# Patient Record
Sex: Male | Born: 1970
Health system: Southern US, Community
[De-identification: ages and names within clinical notes are randomized; demographics above are authoritative.]

## PROBLEM LIST (undated history)

## (undated) DIAGNOSIS — I10 Essential (primary) hypertension: Secondary | ICD-10-CM

---

## 2019-04-02 ENCOUNTER — Other Ambulatory Visit: Payer: Self-pay

## 2019-04-02 ENCOUNTER — Emergency Department (HOSPITAL_BASED_OUTPATIENT_CLINIC_OR_DEPARTMENT_OTHER): Payer: BC Managed Care – PPO

## 2019-04-02 ENCOUNTER — Encounter (HOSPITAL_BASED_OUTPATIENT_CLINIC_OR_DEPARTMENT_OTHER): Payer: Self-pay | Admitting: Emergency Medicine

## 2019-04-02 ENCOUNTER — Emergency Department (HOSPITAL_BASED_OUTPATIENT_CLINIC_OR_DEPARTMENT_OTHER)
Admission: EM | Admit: 2019-04-02 | Discharge: 2019-04-02 | Disposition: A | Discharge status: Home / Self Care | Payer: BC Managed Care – PPO | Attending: Emergency Medicine | Admitting: Emergency Medicine

## 2019-04-02 DIAGNOSIS — N200 Calculus of kidney: Secondary | ICD-10-CM | POA: Insufficient documentation

## 2019-04-02 DIAGNOSIS — R1032 Left lower quadrant pain: Secondary | ICD-10-CM | POA: Diagnosis present

## 2019-04-02 HISTORY — DX: Essential (primary) hypertension: I10

## 2019-04-02 LAB — CBC WITH DIFFERENTIAL/PLATELET
Abs Immature Granulocytes: 0.02 10*3/uL (ref 0.00–0.07)
Basophils Absolute: 0.1 10*3/uL (ref 0.0–0.1)
Basophils Relative: 1 %
Eosinophils Absolute: 0 10*3/uL (ref 0.0–0.5)
Eosinophils Relative: 0 %
HCT: 45.4 % (ref 39.0–52.0)
Hemoglobin: 14.8 g/dL (ref 13.0–17.0)
Immature Granulocytes: 0 %
Lymphocytes Relative: 12 %
Lymphs Abs: 1.2 10*3/uL (ref 0.7–4.0)
MCH: 28.4 pg (ref 26.0–34.0)
MCHC: 32.6 g/dL (ref 30.0–36.0)
MCV: 87.1 fL (ref 80.0–100.0)
Monocytes Absolute: 0.4 10*3/uL (ref 0.1–1.0)
Monocytes Relative: 4 %
Neutro Abs: 8.3 10*3/uL — ABNORMAL HIGH (ref 1.7–7.7)
Neutrophils Relative %: 83 %
Platelets: 260 10*3/uL (ref 150–400)
RBC: 5.21 MIL/uL (ref 4.22–5.81)
RDW: 12.6 % (ref 11.5–15.5)
WBC: 9.9 10*3/uL (ref 4.0–10.5)
nRBC: 0 % (ref 0.0–0.2)

## 2019-04-02 LAB — URINALYSIS, MICROSCOPIC (REFLEX)
Bacteria, UA: NONE SEEN
Squamous Epithelial / HPF: NONE SEEN (ref 0–5)
WBC, UA: NONE SEEN WBC/hpf (ref 0–5)

## 2019-04-02 LAB — BASIC METABOLIC PANEL
Anion gap: 9 (ref 5–15)
BUN: 20 mg/dL (ref 6–20)
CO2: 26 mmol/L (ref 22–32)
Calcium: 9.6 mg/dL (ref 8.9–10.3)
Chloride: 101 mmol/L (ref 98–111)
Creatinine, Ser: 1.12 mg/dL (ref 0.61–1.24)
GFR calc Af Amer: >60 mL/min (ref >60)
GFR calc non Af Amer: >60 mL/min (ref >60)
Glucose, Bld: 144 mg/dL — ABNORMAL HIGH (ref 70–99)
Potassium: 3.8 mmol/L (ref 3.5–5.1)
Sodium: 136 mmol/L (ref 135–145)

## 2019-04-02 LAB — URINALYSIS, ROUTINE W REFLEX MICROSCOPIC
Bilirubin Urine: NEGATIVE
Glucose, UA: NEGATIVE mg/dL
Ketones, ur: NEGATIVE mg/dL
Leukocytes,Ua: NEGATIVE
Nitrite: NEGATIVE
Protein, ur: NEGATIVE mg/dL
Specific Gravity, Urine: 1.025 (ref 1.005–1.030)
pH: 6 (ref 5.0–8.0)

## 2019-04-02 MED ORDER — MORPHINE SULFATE (PF) 4 MG/ML IV SOLN
4.0000 mg | Freq: Once | INTRAVENOUS | Status: AC
  Administered 2019-04-02: 4 mg via INTRAVENOUS
  Filled 2019-04-02: qty 1

## 2019-04-02 MED ORDER — TAMSULOSIN HCL 0.4 MG PO CAPS: 0.4000 mg | ORAL_CAPSULE | Freq: Every day | ORAL | 0 refills | Status: AC

## 2019-04-02 MED ORDER — OXYCODONE HCL 5 MG PO TABS: 5.0000 mg | ORAL_TABLET | ORAL | 0 refills | Status: AC | PRN

## 2019-04-02 MED ORDER — SODIUM CHLORIDE 0.9 % IV BOLUS
1000.0000 mL | Freq: Once | INTRAVENOUS | Status: AC
  Administered 2019-04-02: 07:00:00 1000 mL via INTRAVENOUS

## 2019-04-02 MED ORDER — KETOROLAC TROMETHAMINE 30 MG/ML IJ SOLN
30.0000 mg | Freq: Once | INTRAMUSCULAR | Status: AC
  Administered 2019-04-02: 30 mg via INTRAVENOUS
  Filled 2019-04-02: qty 1

## 2019-04-02 MED ORDER — ONDANSETRON 4 MG PO TBDP: 4.0000 mg | ORAL_TABLET | Freq: Three times a day (TID) | ORAL | 0 refills | Status: AC | PRN

## 2019-04-02 MED ORDER — ONDANSETRON HCL 4 MG/2ML IJ SOLN
4.0000 mg | Freq: Once | INTRAMUSCULAR | Status: AC
  Administered 2019-04-02: 4 mg via INTRAVENOUS
  Filled 2019-04-02: qty 2

## 2019-04-02 MED FILL — TAMSULOSIN HCL 0.4 MG CAP: 0.4 | 14 days supply | Qty: 14 | Fill #0

## 2019-04-02 MED FILL — oxyCODONE HCL 5 MG TABS: 5 | 2 days supply | Qty: 12 | Fill #0

## 2019-04-02 MED FILL — ONDANSETRON ODT 4 MG TABLET: 4 | 5 days supply | Qty: 15 | Fill #0

## 2019-04-02 NOTE — ED Provider Notes (Signed)
  Physical Exam  BP (!) 144/80 (BP Location: Left Arm)   Pulse 78   Temp 97.7 F (36.5 C) (Oral)   Resp 16   Ht 5\' 8"  (1.727 m)   Wt 93 kg   SpO2 97%   BMI 31.17 kg/m   Physical Exam  ED Course/Procedures     Procedures  MDM  Received care from Dr. Dina Rich at [facility]. Please see her note for prior hx/physical.  Briefly this is a 48yo male presenting with flank pain. Found to have 63mm kidney stone. Renal function normal, no UTI.  Given toradol for pain with improvement to 0/10.  Discussed pain control, recommend ibuprofen, tylenol, reviewed in [place] drug database and gave rx for oxycodone with discussion of risks. Given zofran for nausea, flomax. Recommend urology follow up, hydration, discussed reasons to return. Patient discharged in stable condition with understanding of reasons to return.        Gareth Morgan, MD 04/02/19 [phone]

## 2019-04-02 NOTE — ED Notes (Signed)
ED Provider at bedside. 

## 2019-04-02 NOTE — ED Triage Notes (Signed)
Patient presents with complaints of sudden onset left flank pain with nausea around 0330 this am.

## 2019-04-02 NOTE — ED Provider Notes (Signed)
[place] EMERGENCY DEPARTMENT Provider Note   CSN: [id number] Arrival date & time: 04/02/19  9326     History No chief complaint on file.   Cody Chavez is a 48 y.o. male.  HPI     This is a 48 year old male with a history of hypertension who presents with left flank pain.  Patient reports acute onset of left flank pain around 2 AM.  He states the pain has waxed and waned.  It radiates into the left lower quadrant.  Currently his pain level is "an 11 out of 10."  He states that it has improved to an 8 out of 10 at times.  Nothing seems to make it better or worse.  He has never had pain like this before.  He denies any hematuria or dysuria.  He reports nausea without vomiting.  No known history of kidney stones but reports "my doctor found high uric acid levels."  Denies fever, chest pain, shortness of breath, cough, upper respiratory symptoms.  No past medical history on file.  There are no problems to display for this patient.  PMH: HTN  No family history on file.  Social History   Tobacco Use  . Smoking status: Not on file  Substance Use Topics  . Alcohol use: Not on file  . Drug use: Not on file    Home Medications Prior to Admission medications   Not on File    Allergies    Patient has no allergy information on record.  Review of Systems   Review of Systems  Constitutional: Negative for fever.  Respiratory: Negative for shortness of breath.   Cardiovascular: Negative for chest pain.  Gastrointestinal: Positive for nausea. Negative for abdominal pain, diarrhea and vomiting.  Genitourinary: Positive for flank pain. Negative for dysuria and hematuria.  All other systems reviewed and are negative.   Physical Exam Updated Vital Signs BP (!) 148/82 (BP Location: Right Arm)   Pulse 69   Temp 97.7 F (36.5 C) (Oral)   Resp 18   SpO2 98%   Physical Exam Vitals and nursing note reviewed.  Constitutional:      Appearance: He is  well-developed.     Comments: Uncomfortable appearing but nontoxic  HENT:     Head: Normocephalic and atraumatic.     Mouth/Throat:     Mouth: Mucous membranes are moist.  Eyes:     Pupils: Pupils are equal, round, and reactive to light.  Cardiovascular:     Rate and Rhythm: Normal rate and regular rhythm.     Heart sounds: Normal heart sounds. No murmur.  Pulmonary:     Effort: Pulmonary effort is normal. No respiratory distress.     Breath sounds: Normal breath sounds. No wheezing.  Abdominal:     General: Bowel sounds are normal.     Palpations: Abdomen is soft.     Tenderness: There is no abdominal tenderness. There is no rebound.  Musculoskeletal:     Cervical back: Neck supple.  Lymphadenopathy:     Cervical: No cervical adenopathy.  Skin:    General: Skin is warm and dry.  Neurological:     Mental Status: He is alert and oriented to person, place, and time.  Psychiatric:        Mood and Affect: Mood normal.     ED Results / Procedures / Treatments   Labs (all labs ordered are listed, but only abnormal results are displayed) Labs Reviewed  CBC WITH DIFFERENTIAL/PLATELET  BASIC METABOLIC  PANEL  URINALYSIS, ROUTINE W REFLEX MICROSCOPIC    EKG None  Radiology No results found.  Procedures Procedures (including critical care time)  Medications Ordered in ED Medications  sodium chloride 0.9 % bolus 1,000 mL (has no administration in time range)  morphine 4 MG/ML injection 4 mg (has no administration in time range)  ondansetron (ZOFRAN) injection 4 mg (has no administration in time range)    ED Course  I have reviewed the triage vital signs and the nursing notes.  Pertinent labs & imaging results that were available during my care of the patient were reviewed by me and considered in my medical decision making (see chart for details).    MDM Rules/Calculators/A&P                      Patient presents with acute onset of left-sided flank and abdominal  pain.  He is uncomfortable appearing but nontoxic.  History is highly suggestive of kidney stone.  No prior history.  He does not have any tenderness on abdominal exam.  Lab work obtained.  Patient given fluids, pain and nausea medication.  Will obtain a CT stone study.  Patient to be signed out to oncoming provider.   Final Clinical Impression(s) / ED Diagnoses Final diagnoses:  None    Rx / DC Orders ED Discharge Orders    None       Horton, Mayer Masker, MD 04/02/19 [phone]

## 2019-04-02 NOTE — Discharge Instructions (Addendum)
You may take Tylenol (acetaminophen) 1000 mg 3 times a day OR 500mg  6 times a day. Please check other over-the-counter medications and prescriptions to ensure you are not taking other medications that contain acetaminophen at this dose.  You may also take ibuprofen 400 mg 6 times a day alternating with or at the same time as tylenol.  Take oxycodone as needed for breakthrough pain.  This medication can be addicting, sedating and cause constipation.

## 2020-06-11 IMAGING — CT CT RENAL STONE PROTOCOL
2 of 4 series · 15 of 46 positions shown, 17 images · non-contrast
Comparison: None.

CLINICAL DATA: Left flank pain

EXAM:
CT ABDOMEN AND PELVIS WITHOUT CONTRAST
TECHNIQUE: Multidetector CT imaging of the abdomen and pelvis was performed
following the standard protocol without oral or IV contrast.

[Series 2: axial st · axial · 0.86mm/px · z∈[-599,-29]mm · 12 of 126 slices shown, 14 images]
[im 6/126  soft-tissue]
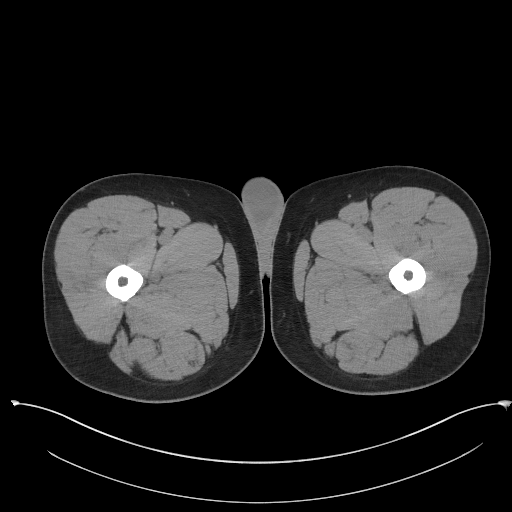
[im 6/126  bone]
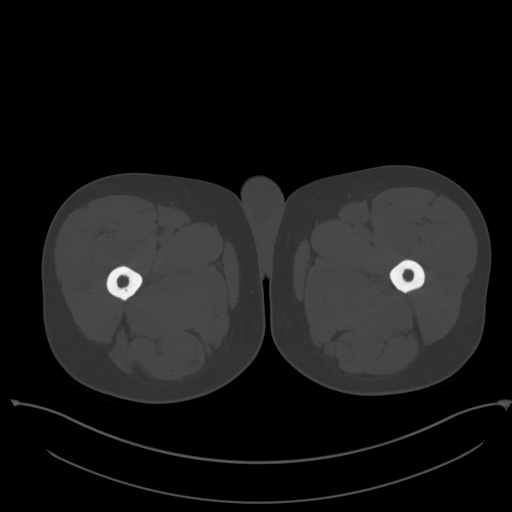
[im 16/126  soft-tissue]
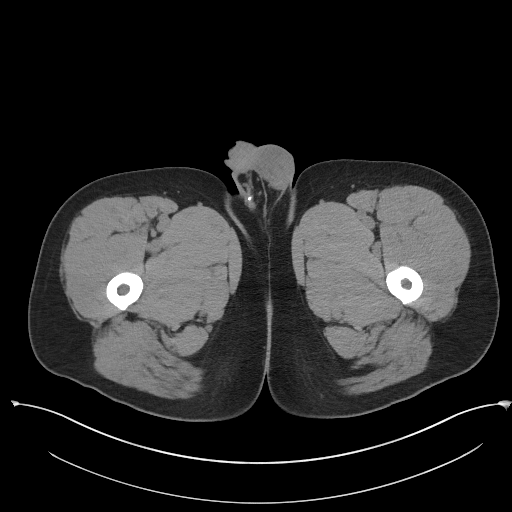
[im 27/126  soft-tissue]
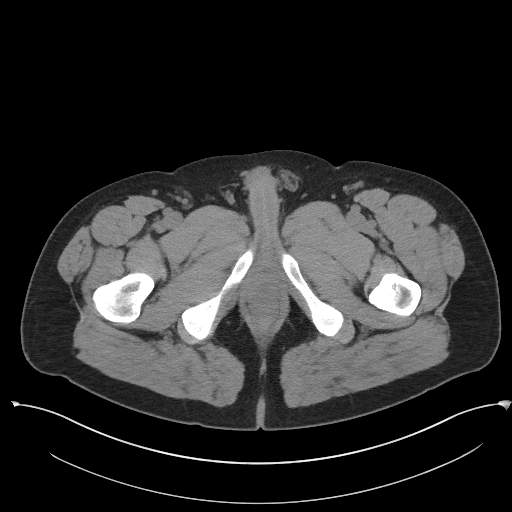
[im 37/126  soft-tissue]
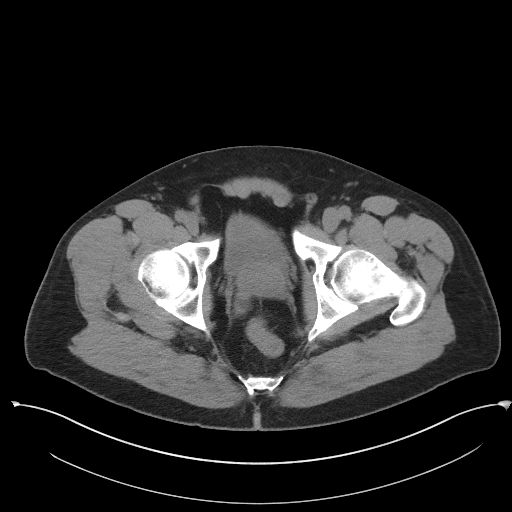
[im 47/126  soft-tissue]
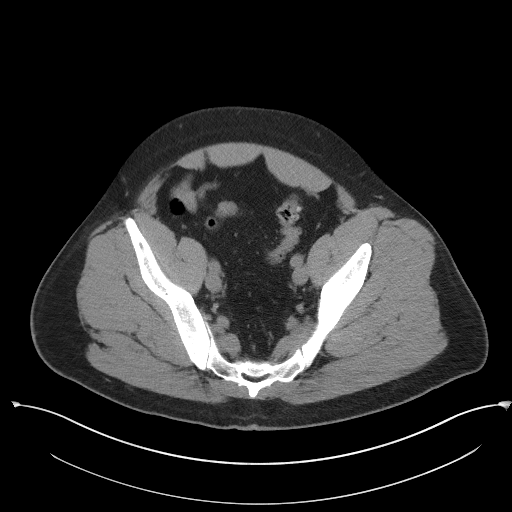
[im 58/126  soft-tissue]
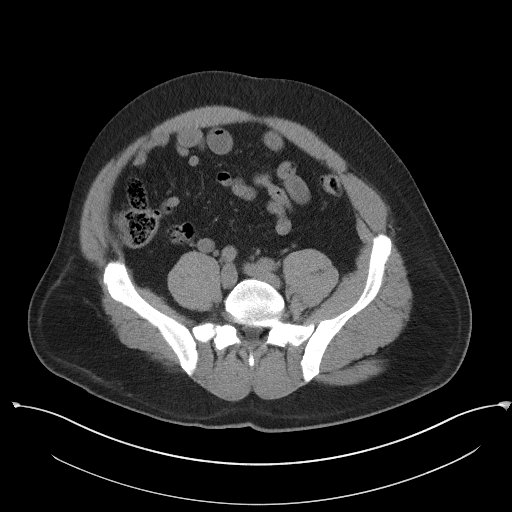
[im 68/126  soft-tissue]
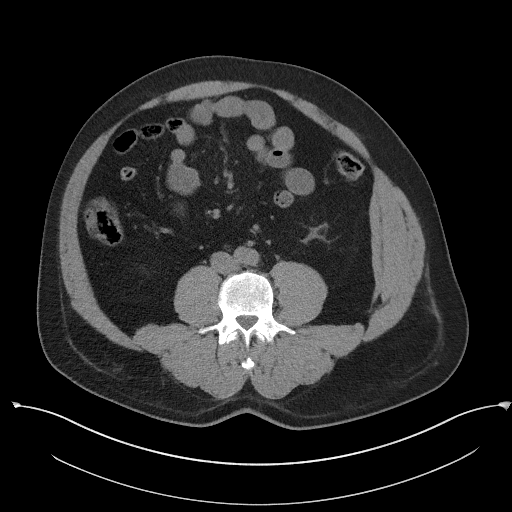
[im 79/126  soft-tissue]
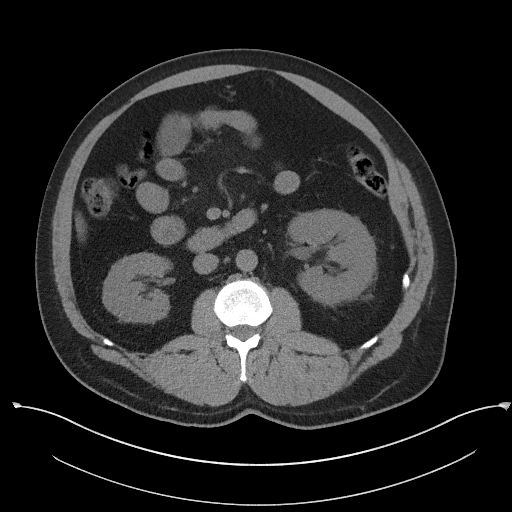
[im 89/126  soft-tissue]
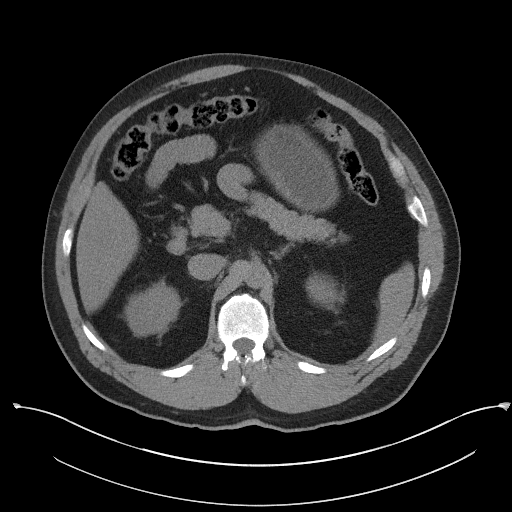
[im 89/126  bone]
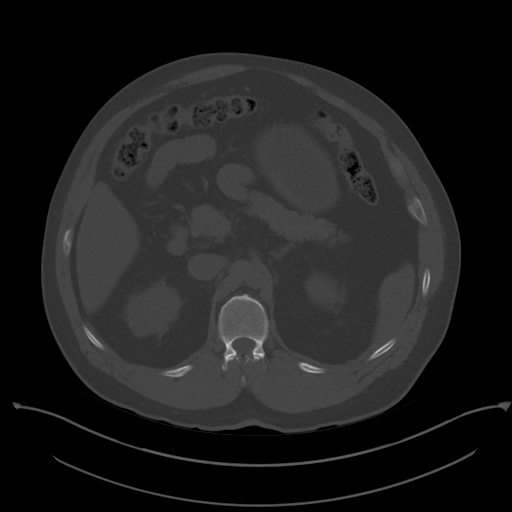
[im 99/126  soft-tissue]
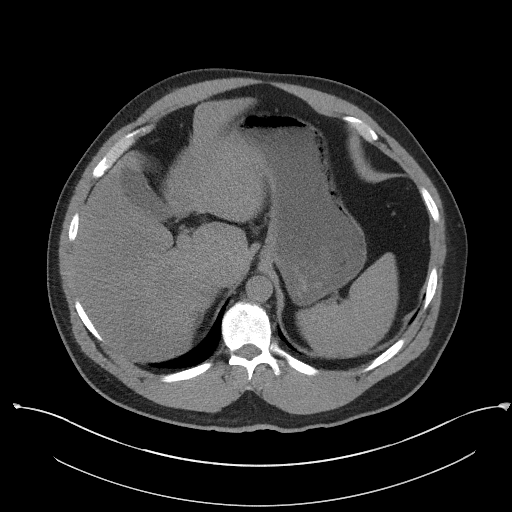
[im 110/126  soft-tissue]
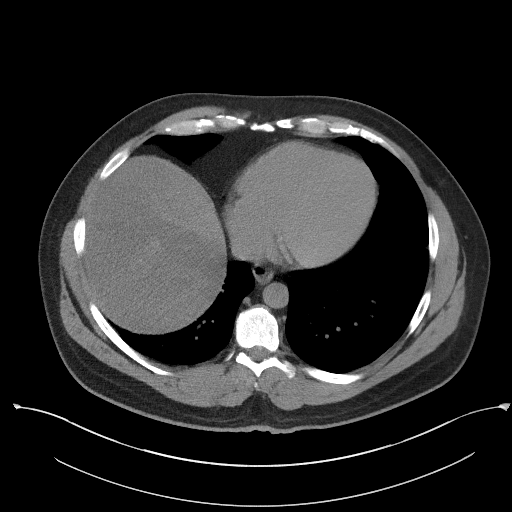
[im 120/126  soft-tissue]
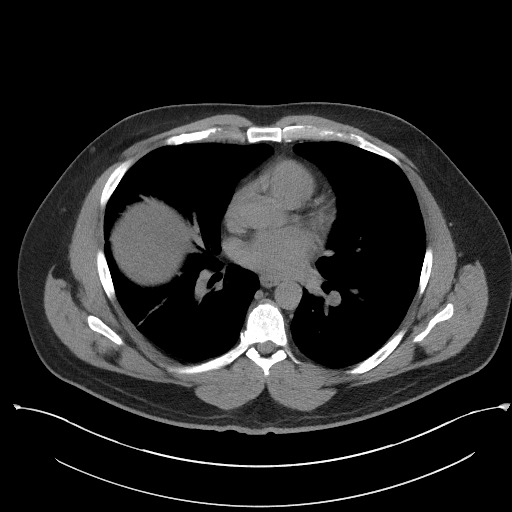

[Series 4: coronal st · coronal · 0.91mm/px · 3 of 111 slices shown]
[im 37/111  soft-tissue]
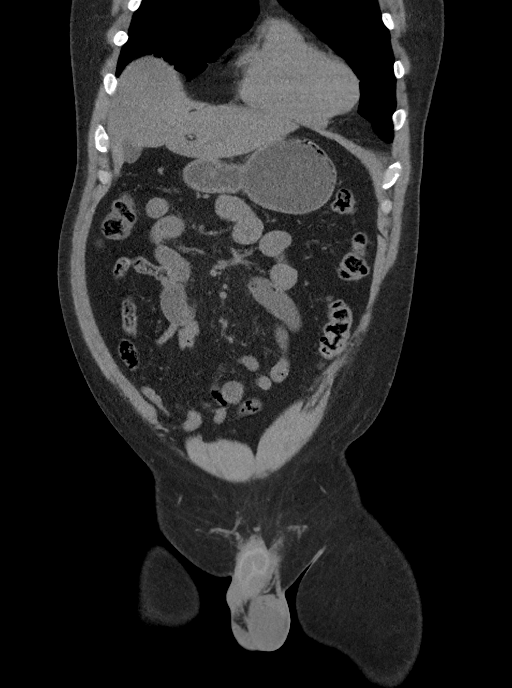
[im 49/111  soft-tissue]
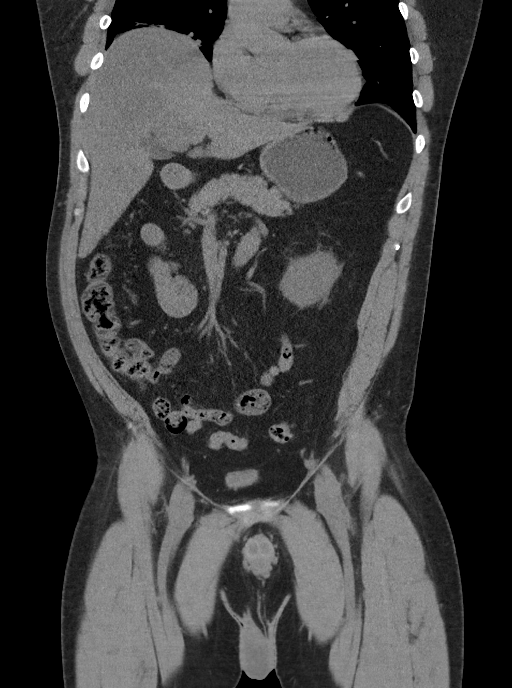
[im 62/111  soft-tissue]
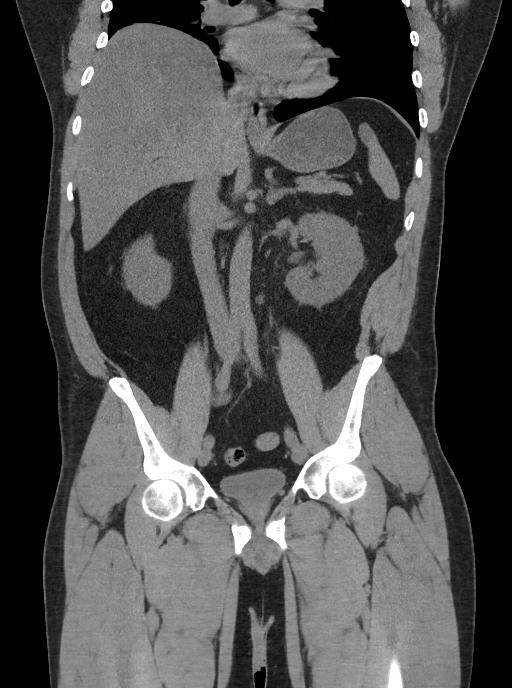

[15 of 46 positions shown; findings below may reference images not displayed]

FINDINGS: Lower chest: There is mild atelectatic change in the left base.
There is a nodular opacity along the lateral aspect of the left
minor fissure measuring 7 x 5 mm, seen on axial slice 18 series 6.

Hepatobiliary: There is hepatic steatosis. No focal liver lesions
are evident on this noncontrast enhanced study. Gallbladder wall is
not appreciably thickened. There is no biliary duct dilatation.

Pancreas: There is no pancreatic mass or inflammatory focus.

Spleen: No splenic lesions are evident.

Adrenals/Urinary Tract: Right adrenal appears normal. There is an
adenoma arising from the left adrenal measuring 1.2 x 1.1 cm. Left
kidney is edematous. There is no appreciable renal mass on either
side. There is slight hydronephrosis on the left. There is no
hydronephrosis on the right. On the right, there is a 2 mm calculus
in the upper to midportion of the kidney. On the left, there is a 3
x 2 mm calculus in the lower pole region. There is a calculus in the
proximal left ureter at the L3 level measuring 4 x 3 mm. No other
ureteral calculus is evident on either side. Urinary bladder is
midline with wall thickness within normal limits.

Stomach/Bowel: There are scattered sigmoid diverticula without
diverticulitis. There is no appreciable bowel wall or mesenteric
thickening. There is no evident bowel obstruction. The terminal
ileum appears normal. There is no demonstrable free air or portal
venous air.

Vascular/Lymphatic: There is no abdominal aortic aneurysm. No
vascular lesions are evident on this noncontrast enhanced study.
There is no adenopathy in the abdomen or pelvis.

Reproductive: Prostate and seminal vesicles are normal in size and
contour. There is no evident pelvic mass.

Other: There is no periappendiceal region inflammation. No abscess
or ascites is evident in the abdomen or pelvis. There is mild fat in
the umbilicus. There is mild fat in the right inguinal ring.

Musculoskeletal: There is degenerative change in the lumbar spine.
There are no blastic or lytic bone lesions. No intramuscular lesions
are evident.
IMPRESSION: 1. There is a 4 x 3 mm calculus in the proximal left ureter at the
L3 level with slight hydronephrosis on the left and edema of the
left kidney.
2. Bilateral nephrolithiasis.
3. There is a 7 x 5 mm nodular opacity in the lateral left base
region. Non-contrast chest CT at 6-12 months is recommended. If the
nodule is stable at time of repeat CT, then future CT at 18-24
months (from today's scan) is considered optional for low-risk
patients, but is recommended for high-risk patients. This
recommendation follows the consensus statement: Guidelines for
Management of Incidental Pulmonary Nodules Detected on CT Images:
4. Left adrenal adenoma measuring 1.2 x 1.1 cm.
5. Sigmoid diverticulosis without diverticulitis.
6. Small amount of fat in the umbilicus. Mild fat in the right
inguinal ring.
7. Hepatic steatosis.

## 2021-05-04 ENCOUNTER — Emergency Department (HOSPITAL_BASED_OUTPATIENT_CLINIC_OR_DEPARTMENT_OTHER)
Admission: EM | Admit: 2021-05-04 | Discharge: 2021-05-04 | Disposition: A | Discharge status: Home / Self Care | Payer: BC Managed Care – PPO | Attending: Emergency Medicine | Admitting: Emergency Medicine

## 2021-05-04 ENCOUNTER — Other Ambulatory Visit: Payer: Self-pay

## 2021-05-04 ENCOUNTER — Encounter (HOSPITAL_BASED_OUTPATIENT_CLINIC_OR_DEPARTMENT_OTHER): Payer: Self-pay | Admitting: Urology

## 2021-05-04 ENCOUNTER — Emergency Department (HOSPITAL_BASED_OUTPATIENT_CLINIC_OR_DEPARTMENT_OTHER): Payer: BC Managed Care – PPO

## 2021-05-04 DIAGNOSIS — Z7982 Long term (current) use of aspirin: Secondary | ICD-10-CM | POA: Diagnosis not present

## 2021-05-04 DIAGNOSIS — R0789 Other chest pain: Secondary | ICD-10-CM | POA: Insufficient documentation

## 2021-05-04 DIAGNOSIS — Z79899 Other long term (current) drug therapy: Secondary | ICD-10-CM | POA: Diagnosis not present

## 2021-05-04 DIAGNOSIS — I10 Essential (primary) hypertension: Secondary | ICD-10-CM | POA: Diagnosis not present

## 2021-05-04 DIAGNOSIS — R079 Chest pain, unspecified: Secondary | ICD-10-CM

## 2021-05-04 LAB — BASIC METABOLIC PANEL
Anion gap: 9 (ref 5–15)
BUN: 20 mg/dL (ref 6–20)
CO2: 24 mmol/L (ref 22–32)
Calcium: 9.4 mg/dL (ref 8.9–10.3)
Chloride: 102 mmol/L (ref 98–111)
Creatinine, Ser: 1.08 mg/dL (ref 0.61–1.24)
GFR, Estimated: >60 mL/min (ref >60)
Glucose, Bld: 113 mg/dL — ABNORMAL HIGH (ref 70–99)
Potassium: 3.9 mmol/L (ref 3.5–5.1)
Sodium: 135 mmol/L (ref 135–145)

## 2021-05-04 LAB — TROPONIN I (HIGH SENSITIVITY): Troponin I (High Sensitivity): 3 ng/L (ref <18)

## 2021-05-04 LAB — CBC
HCT: 45 % (ref 39.0–52.0)
Hemoglobin: 15.5 g/dL (ref 13.0–17.0)
MCH: 29.5 pg (ref 26.0–34.0)
MCHC: 34.4 g/dL (ref 30.0–36.0)
MCV: 85.6 fL (ref 80.0–100.0)
Platelets: 256 10*3/uL (ref 150–400)
RBC: 5.26 MIL/uL (ref 4.22–5.81)
RDW: 12.1 % (ref 11.5–15.5)
WBC: 6.9 10*3/uL (ref 4.0–10.5)
nRBC: 0 % (ref 0.0–0.2)

## 2021-05-04 NOTE — ED Triage Notes (Signed)
Left sided chest pain that started at approx 1200.  Had same pain on Sunday that went away  H/o htn , took asa 81 mg today  Denies SOB  Ran yesterday 2.5 miles with no concerns  NAD now A& O x 4

## 2021-05-04 NOTE — ED Provider Notes (Signed)
[name]   CSN: [id number] Arrival date & time: 05/04/21  1654     History  Chief Complaint  Patient presents with   Chest Pain    Cody Chavez is a 51 y.o. male.  Presents to ER with concern for chest pain.  Patient states that pain started around noon today.  Described as 2-3 out of 10 in severity, intermittent, left-sided, discomfort.  No frank pain.  Nonradiating.  No obvious alleviating or aggravating factors.  Not associated with exertion.  States that he did incline workout on treadmill early this morning and had no symptoms at that time.  Pain started 8 hours later.  Also went on a run yesterday and had noted symptoms.  No associated difficulty breathing.  He does have history of high cholesterol and high blood pressure.  Denies smoking history.  No history of coronary artery disease in first-degree relative.  No known CAD history.  HPI     Home Medications Prior to Admission medications   Medication Sig Start Date End Date Taking? Authorizing Provider  allopurinol (ZYLOPRIM) 100 MG tablet Take by mouth. 11/14/18   [provider]  aspirin 81 MG EC tablet Take by mouth.    [provider]  atorvastatin (LIPITOR) 10 MG tablet Take 1 tablet by mouth once daily 06/19/18   [provider]  lisinopril (ZESTRIL) 20 MG tablet Take 1 tablet by mouth once daily 09/09/16   [provider]  ondansetron (ZOFRAN ODT) 4 MG disintegrating tablet Take 1 tablet (4 mg total) by mouth every 8 (eight) hours as needed for nausea or vomiting. 04/02/19   Gareth Morgan, MD  oxyCODONE (ROXICODONE) 5 MG immediate release tablet Take 1 tablet (5 mg total) by mouth every 4 (four) hours as needed for severe pain. 04/02/19   Gareth Morgan, MD      Allergies    Patient has no known allergies.    Review of Systems   Review of Systems  Constitutional:  Negative for chills and fever.  HENT:  Negative for ear pain and  sore throat.   Eyes:  Negative for pain and visual disturbance.  Respiratory:  Negative for cough, chest tightness and shortness of breath.   Cardiovascular:  Positive for chest pain. Negative for palpitations.  Gastrointestinal:  Negative for abdominal pain and vomiting.  Genitourinary:  Negative for dysuria and hematuria.  Musculoskeletal:  Negative for arthralgias and back pain.  Skin:  Negative for color change and rash.  Neurological:  Negative for seizures and syncope.  All other systems reviewed and are negative.  Physical Exam Updated Vital Signs BP (!) 153/92    Pulse 73    Temp 98.2 F (36.8 C) (Oral)    Resp 20    Ht 5\' 8"  (1.727 m)    Wt 97.1 kg    SpO2 97%    BMI 32.54 kg/m  Physical Exam Vitals and nursing note reviewed.  Constitutional:      General: He is not in acute distress.    Appearance: He is well-developed.  HENT:     Head: Normocephalic and atraumatic.  Eyes:     Conjunctiva/sclera: Conjunctivae normal.  Cardiovascular:     Rate and Rhythm: Normal rate and regular rhythm.     Heart sounds: No murmur heard. Pulmonary:     Effort: Pulmonary effort is normal. No respiratory distress.     Breath sounds: Normal breath sounds.  Abdominal:     Palpations: Abdomen  is soft.     Tenderness: There is no abdominal tenderness.  Musculoskeletal:        General: No swelling.     Cervical back: Neck supple.     Right lower leg: No edema.     Left lower leg: No edema.  Skin:    General: Skin is warm and dry.     Capillary Refill: Capillary refill takes less than 2 seconds.  Neurological:     Mental Status: He is alert.  Psychiatric:        Mood and Affect: Mood normal.    ED Results / Procedures / Treatments   Labs (all labs ordered are listed, but only abnormal results are displayed) Labs Reviewed  BASIC METABOLIC PANEL - Abnormal; Notable for the following components:      Result Value   Glucose, Bld 113 (*)    All other components within normal limits   CBC  TROPONIN I (HIGH SENSITIVITY)  TROPONIN I (HIGH SENSITIVITY)    EKG None  Radiology DG Chest 2 View  Result Date: 05/04/2021 CLINICAL DATA:  Chest pain EXAM: CHEST - 2 VIEW COMPARISON:  None. FINDINGS: Elevation of the right hemidiaphragm. This was present on a 2020 abdomen CT. Atelectasis/scarring at the right lung base. No pleural effusion or pneumothorax. Cardiomediastinal contours are within normal limits. No acute osseous abnormality. IMPRESSION: Chronic elevation of right hemidiaphragm with mild atelectasis/scarring at the lung base. No acute abnormality identified. Electronically Signed   By: Macy Mis M.D.   On: 05/04/2021 17:33    Procedures Procedures    Medications Ordered in ED Medications - No data to display  ED Course/ Medical Decision Making/ A&P                           Medical Decision Making Amount and/or Complexity of Data Reviewed Labs: ordered. Radiology: ordered.   51 y/o male with HL, HTN presented to ER after having episode of chest discomfort this afternoon.  Currently pain-free and well-appearing with grossly stable vital signs.  His EKG does not have any acute ischemic change and his troponin is within normal limits.  CXR independently reviewed, no acute pathology identified.  No anemia or electrolyte derangement.  Notably patient consistently works out and has never had any chest discomfort associated with exertion.  Given his history and the work-up today I have a very low suspicion for ACS.  Given patient is no longer asymptomatic and has low HEART score and reassuring work-up today, feel he can be discharged and managed in the outpatient setting.  Recommend he follow-up closely with his primary care doctor to discuss this episode, reviewed return precautions with patient as well as his wife at bedside.  Discharged home.        Final Clinical Impression(s) / ED Diagnoses Final diagnoses:  Chest pain, unspecified type    Rx / DC  Orders ED Discharge Orders     None         Lucrezia Starch, MD 05/04/21 2302

## 2021-05-04 NOTE — Discharge Instructions (Signed)
I would recommend following up with your primary care doctor to discuss symptoms that you are experiencing today.  If you develop recurrent chest pain, any difficulty breathing or other new concerning symptom, please come back to ER for reassessment.

## 2022-07-14 IMAGING — DX DG CHEST 2V
2 series · 2 of 2 positions shown · non-contrast
Comparison: None.

CLINICAL DATA: Chest pain

EXAM:
CHEST - 2 VIEW

[chest pa]
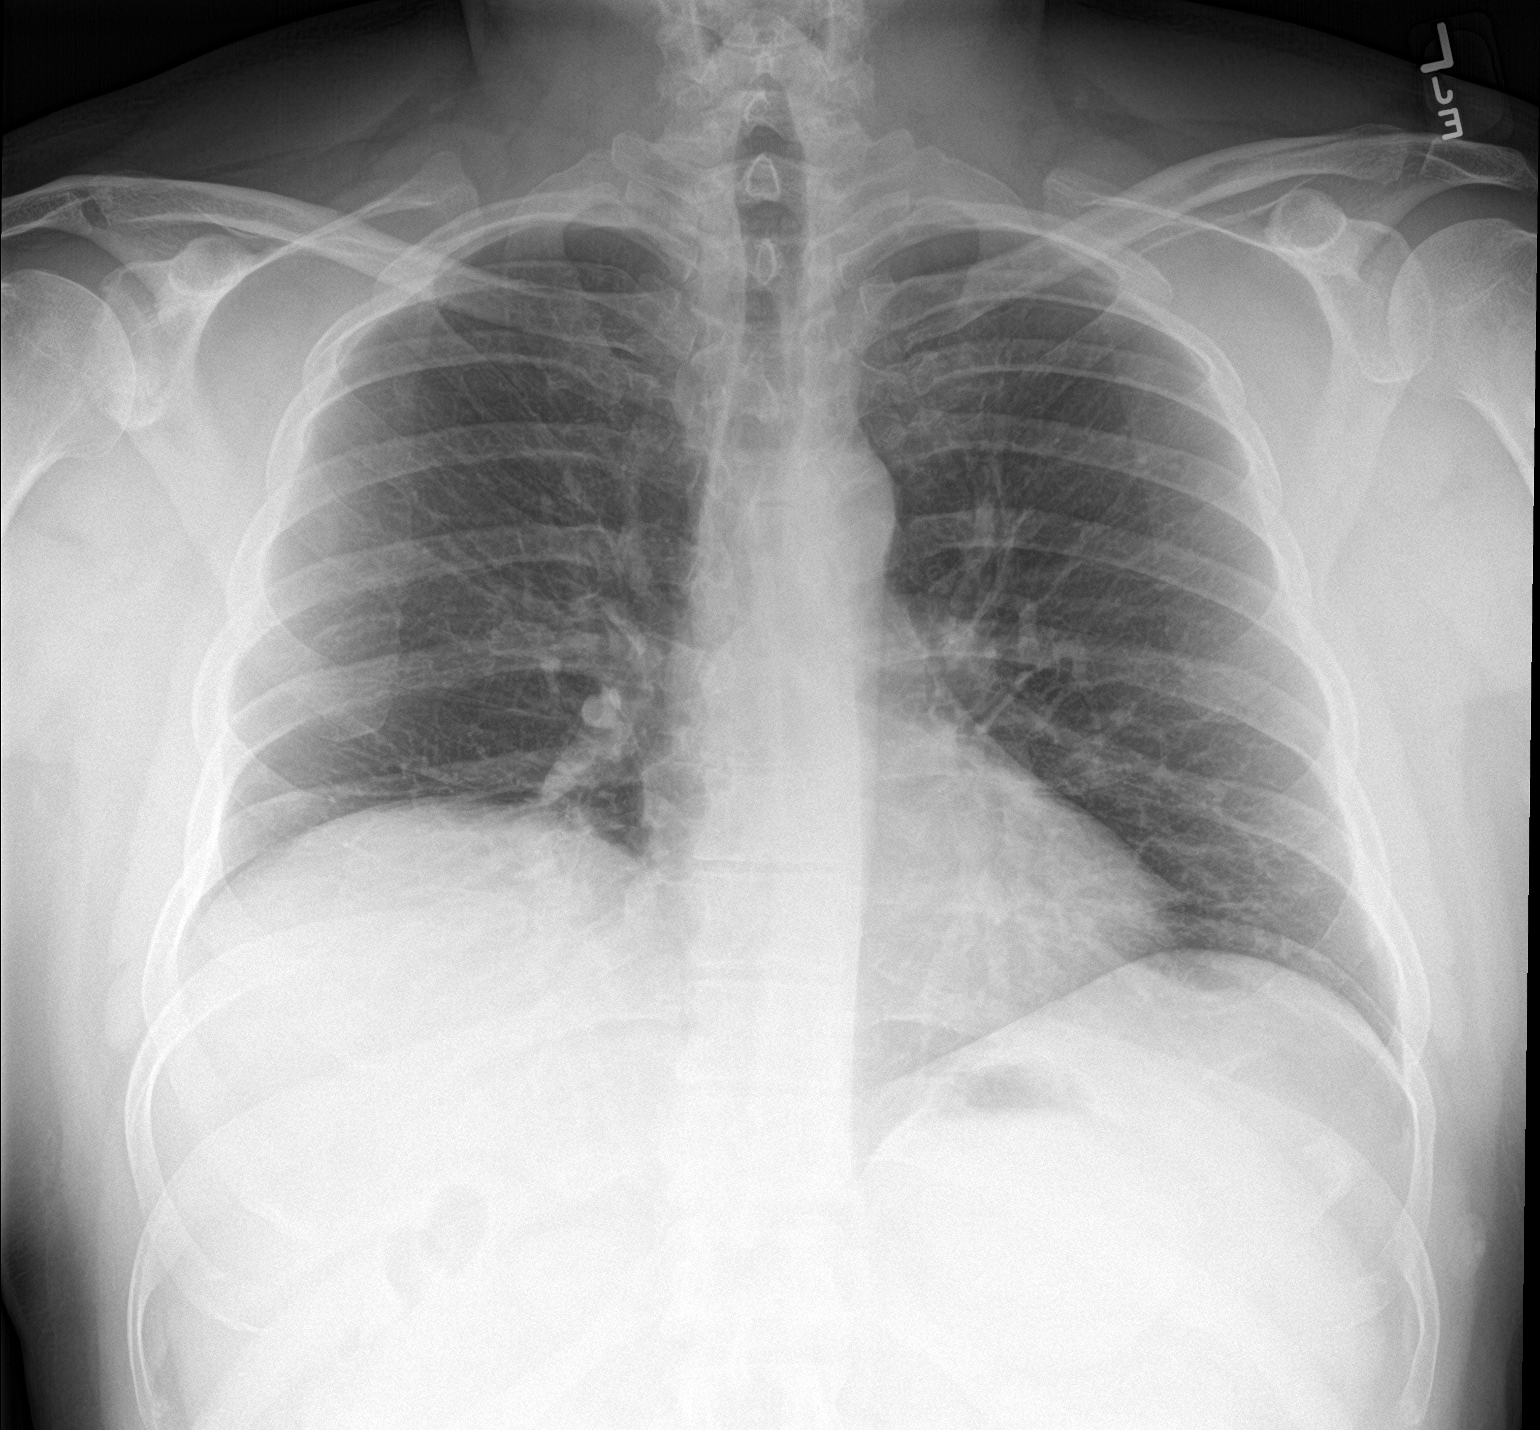

[chest lat]
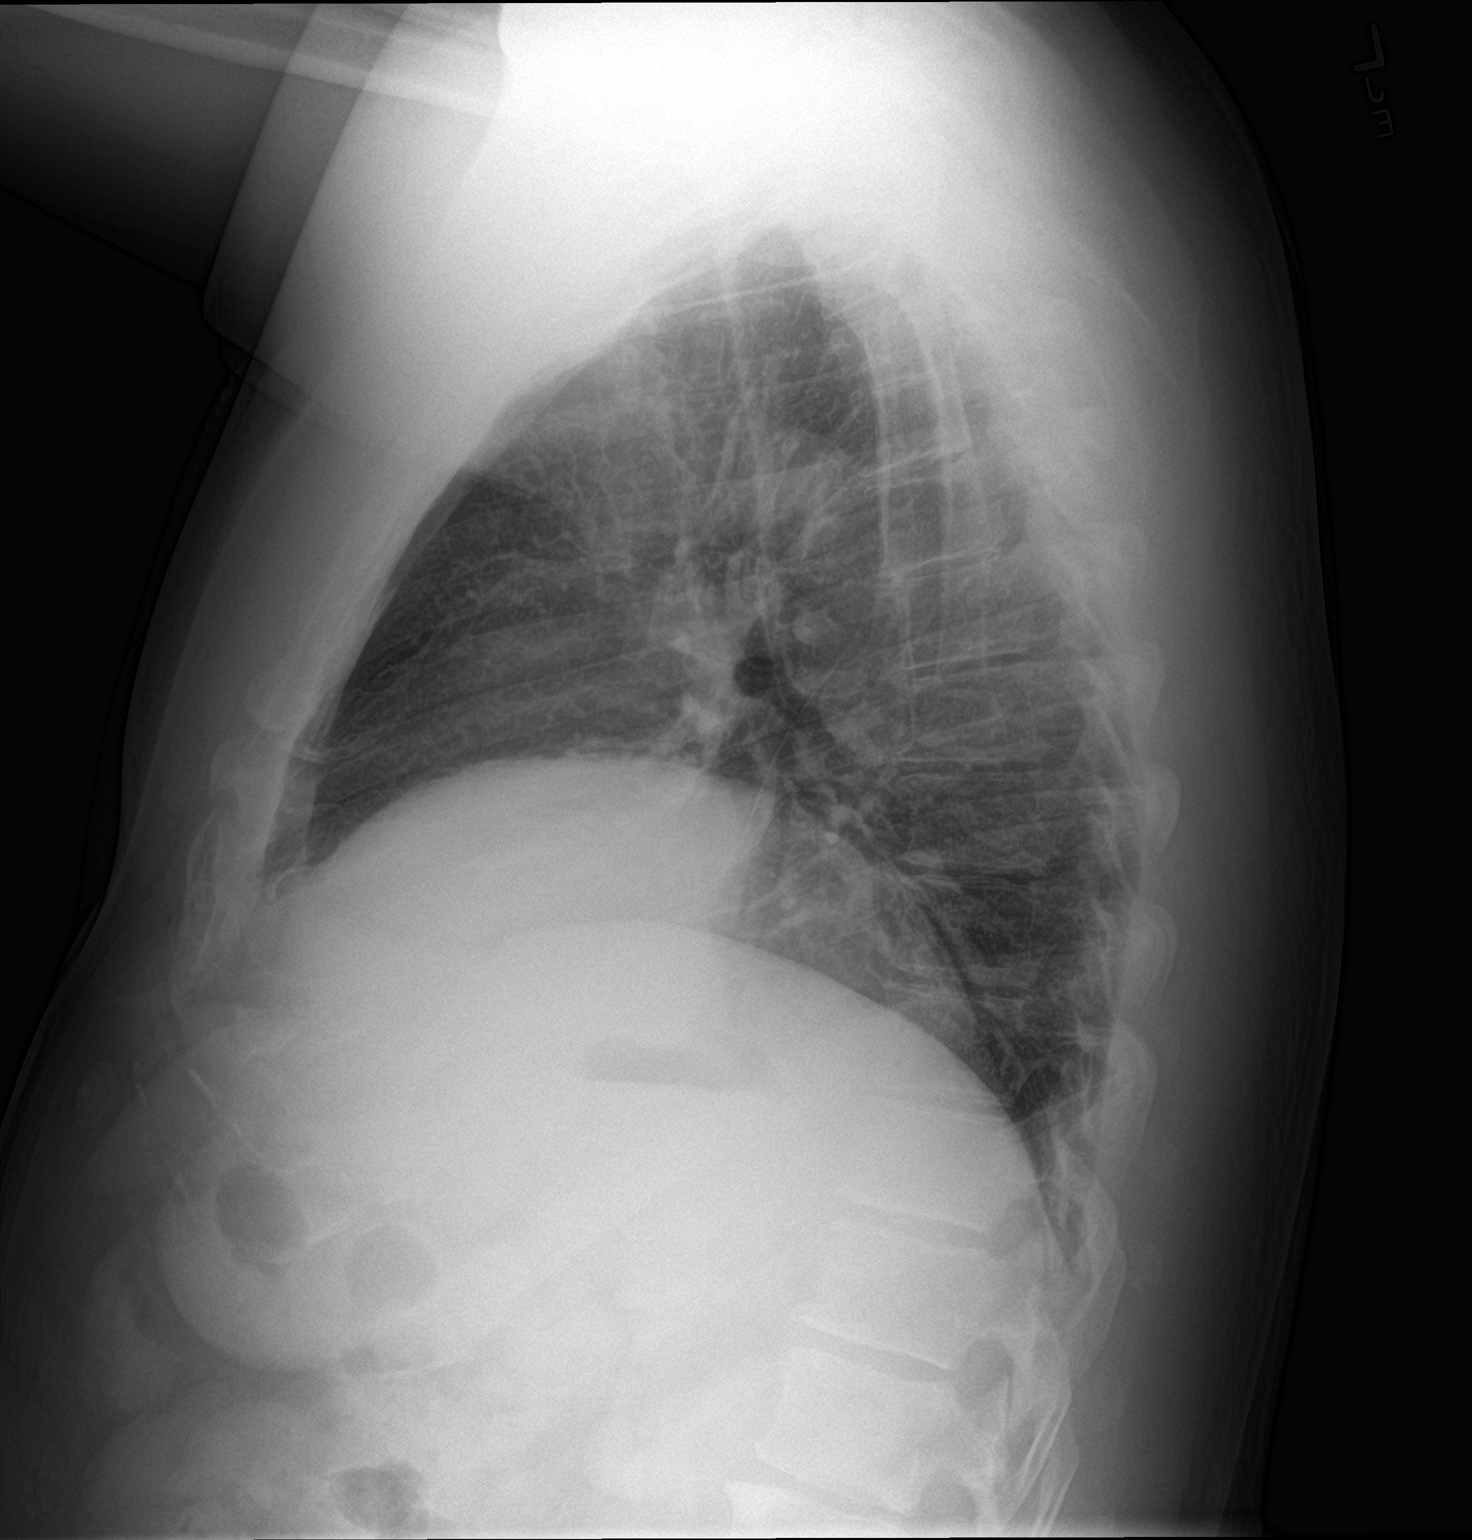

[2 of 2 positions shown; findings below may reference images not displayed]

FINDINGS: Elevation of the right hemidiaphragm. This was present on a 7373
abdomen CT. Atelectasis/scarring at the right lung base. No pleural
effusion or pneumothorax. Cardiomediastinal contours are within
normal limits. No acute osseous abnormality.
IMPRESSION: Chronic elevation of right hemidiaphragm with mild
atelectasis/scarring at the lung base. No acute abnormality
identified.

## 2023-01-10 ENCOUNTER — Other Ambulatory Visit: Payer: Self-pay

## 2023-01-10 ENCOUNTER — Emergency Department (HOSPITAL_BASED_OUTPATIENT_CLINIC_OR_DEPARTMENT_OTHER)
Admission: EM | Admit: 2023-01-10 | Discharge: 2023-01-10 | Discharge status: Against Medical Advice | Payer: BC Managed Care – PPO | Source: Home / Self Care
